# Patient Record
Sex: Female | Born: 2000 | Race: White | Hispanic: No | Marital: Single | Smoking: Never smoker
Health system: Southern US, Community
[De-identification: ages and names within clinical notes are randomized; demographics above are authoritative.]

## PROBLEM LIST (undated history)

## (undated) DIAGNOSIS — B009 Herpesviral infection, unspecified: Secondary | ICD-10-CM

## (undated) DIAGNOSIS — A64 Unspecified sexually transmitted disease: Secondary | ICD-10-CM

## (undated) HISTORY — PX: NO PAST SURGERIES: SHX2092

## (undated) HISTORY — DX: Unspecified sexually transmitted disease: A64

## (undated) HISTORY — DX: Herpesviral infection, unspecified: B00.9

---
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
## 2021-06-26 ENCOUNTER — Encounter: Payer: Self-pay | Admitting: Obstetrics

## 2021-06-27 ENCOUNTER — Other Ambulatory Visit: Payer: Self-pay | Admitting: Obstetrics

## 2021-06-27 DIAGNOSIS — A609 Anogenital herpesviral infection, unspecified: Secondary | ICD-10-CM

## 2021-06-27 MED ORDER — VALACYCLOVIR HCL 1 G PO TABS: 1000.0000 mg | ORAL_TABLET | Freq: Every day | ORAL | 6 refills | Status: AC

## 2021-06-27 NOTE — Telephone Encounter (Signed)
Patient called in reporting HSV outbreak. Rx for Valtrex 1000 mg po daily sent to pharmacy. She is overdue for an annual PE and is advised to make an appointment.  Mirna Mires, CNM  06/27/2021 11:01 AM

## 2021-06-27 NOTE — Telephone Encounter (Signed)
Can you send to [place]?

## 2022-10-12 IMAGING — CR DG CHEST 2V
2 series · 2 of 2 positions shown · non-contrast
Comparison: None.

CLINICAL DATA: Chest pain.  Cough and shortness of breath.

EXAM:
CHEST - 2 VIEW

[chest pa]
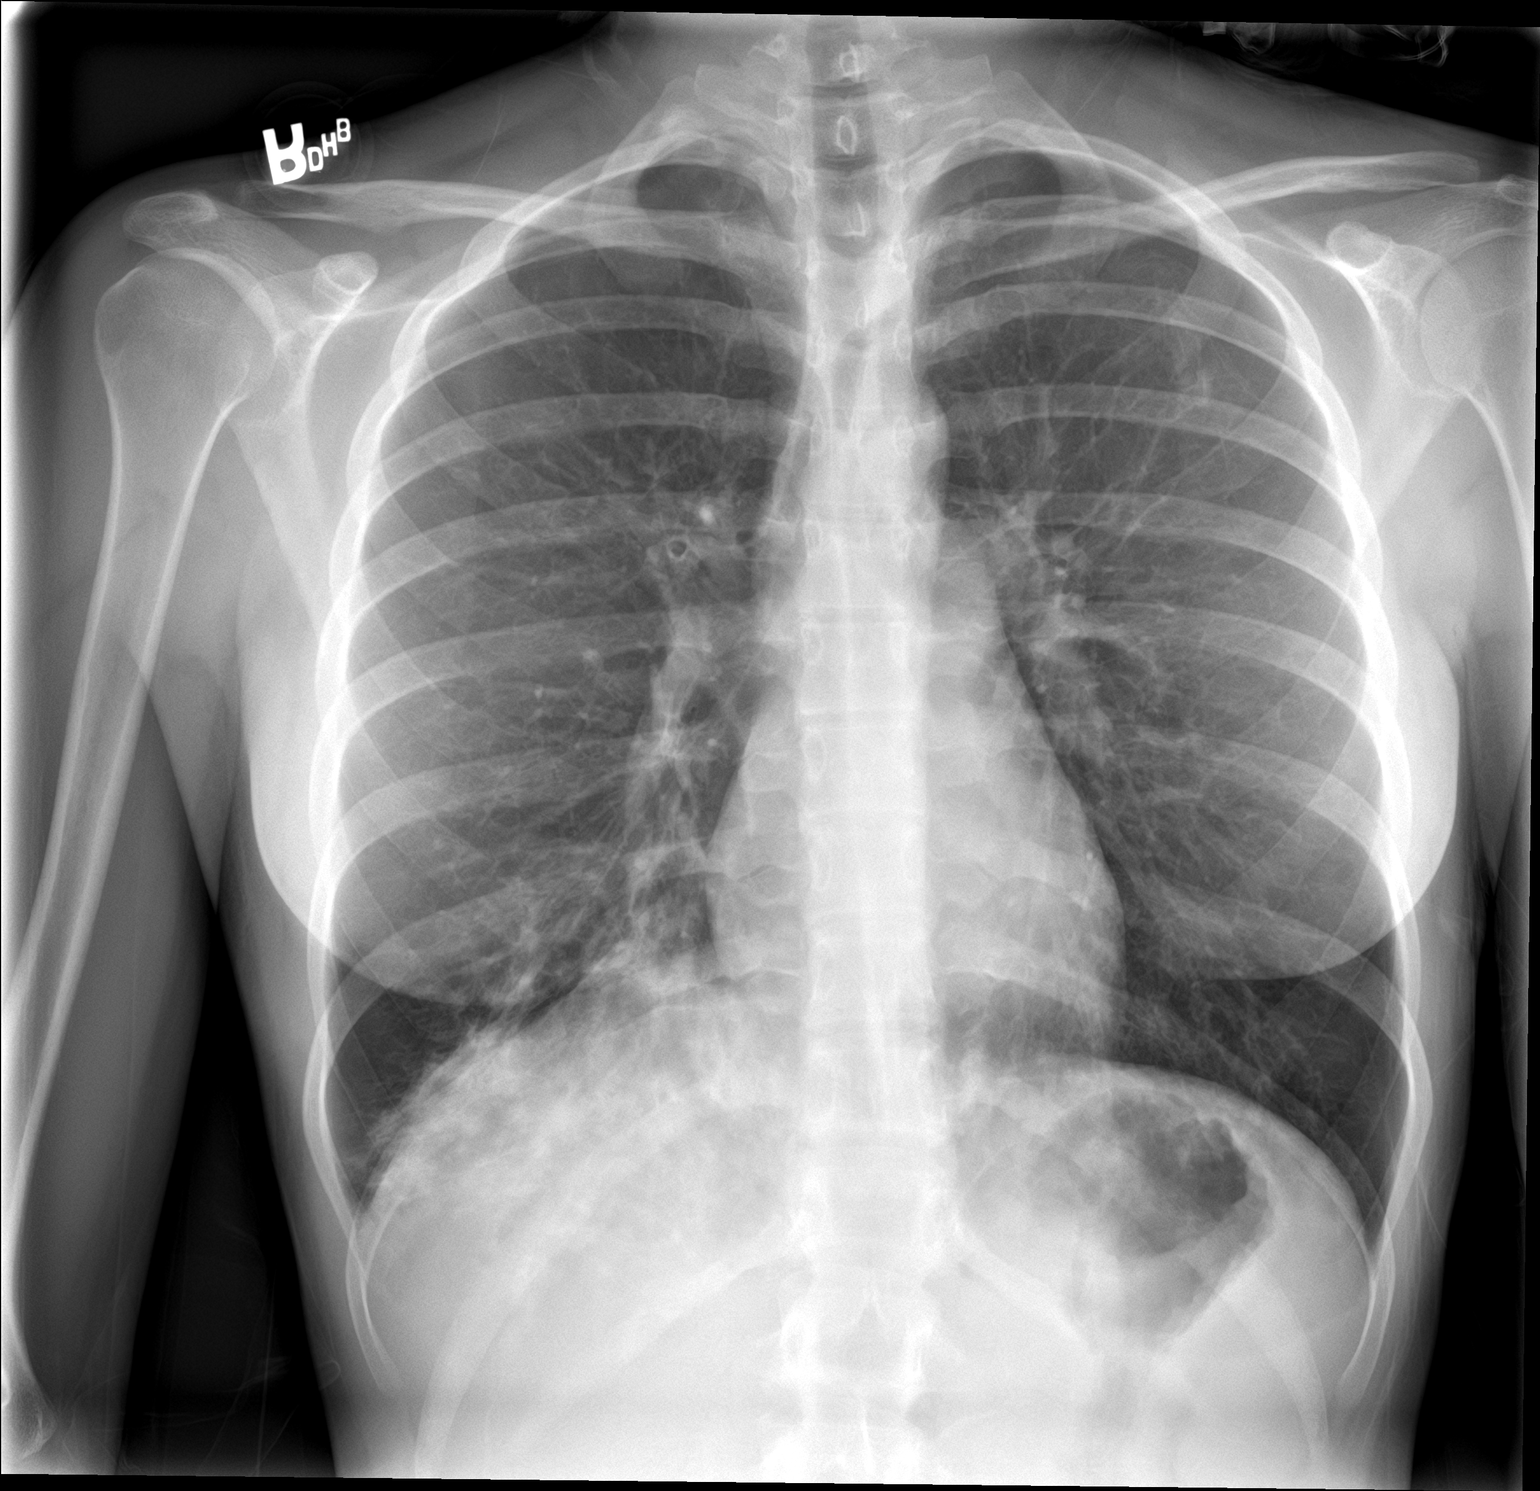

[chest lat]
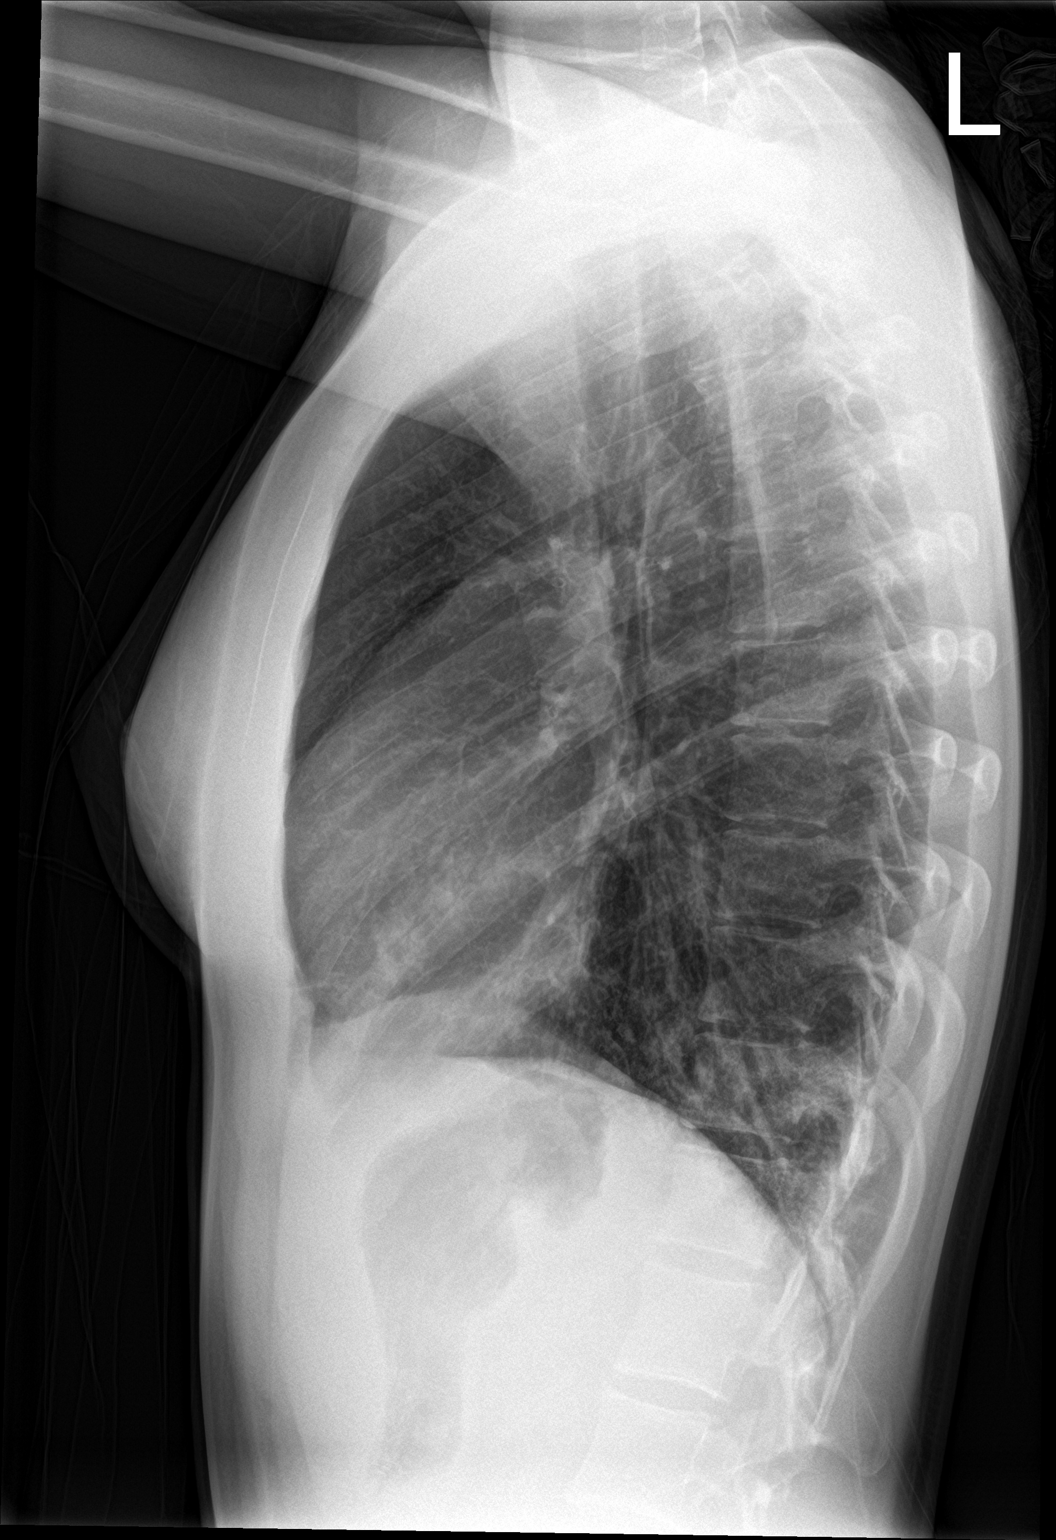

[2 of 2 positions shown; findings below may reference images not displayed]

FINDINGS: Patchy right lower lobe airspace disease consistent with pneumonia.
There is minimal right middle lobe involvement is well. Left lung is
clear. Heart is normal in size. No pleural effusion or pneumothorax.
No osseous abnormalities are seen.
IMPRESSION: Right lower lobe and right middle lobe pneumonia.

## 2023-09-27 ENCOUNTER — Ambulatory Visit: Payer: Self-pay | Admitting: Medical

## 2023-09-27 ENCOUNTER — Encounter: Payer: Self-pay | Admitting: Medical

## 2023-09-27 ENCOUNTER — Other Ambulatory Visit: Payer: Self-pay

## 2023-09-27 VITALS — BP 119/76 | HR 67 | Temp 99.1°F | Wt 98.0 lb

## 2023-09-27 DIAGNOSIS — Z113 Encounter for screening for infections with a predominantly sexual mode of transmission: Secondary | ICD-10-CM

## 2023-09-27 DIAGNOSIS — R300 Dysuria: Secondary | ICD-10-CM

## 2023-09-27 LAB — POCT URINALYSIS DIPSTICK (MANUAL)
Nitrite, UA: NEGATIVE
Poct Bilirubin: NEGATIVE
Poct Glucose: NORMAL mg/dL
Poct Ketones: NEGATIVE
Poct Protein: NEGATIVE mg/dL
Poct Urobilinogen: NORMAL mg/dL
Spec Grav, UA: 1.01 (ref 1.010–1.025)
pH, UA: 6 (ref 5.0–8.0)

## 2023-09-27 MED ORDER — NITROFURANTOIN MONOHYD MACRO 100 MG PO CAPS: 100.0000 mg | ORAL_CAPSULE | Freq: Two times a day (BID) | ORAL | 0 refills | Status: AC

## 2023-09-27 NOTE — Patient Instructions (Signed)
-  Take antibiotic every 12 hours (twice a day) with food; Finish all antibiotics. -Drink plenty of water. Avoid or limit alcohol and caffeine, which may make symptoms worse. -You may take an over-the-counter pain reliever (i.e., AZO urinary pain relief, Tylenol) as needed for pain next 1-2 days. -You will receive a MyChart message notifying you of your lab results when they are available.  -Send secure message to provider or schedule return appointment as needed for new/worsening symptoms (such as fever or abdominal pain), if your symptoms are not improving after 2-3 days taking antibiotics or if your symptoms do not completely resolve following antibiotics.

## 2023-09-27 NOTE — Progress Notes (Signed)
 [facility] 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  Has had one previous UTI, 3 years ago. Thinks current sx feel similar.   Sx began this AM. Some burning after urination. Some urgency. Some increased frequency but also drinking more water today. No unusual vaginal discharge or itching. Mild lower abdominal discomfort with urination. No back pain, fever, chills or nausea.   Sexually active with same female partner, has been with them or several months. Uses condoms most of time. Has not had sex in over a week. Thinks she may have gotten dehydrated, drinking alcohol more often than usual with graduation/end-of-year festivities.   Last STI testing in summer 2024.    Current Medication:  Outpatient Encounter Medications as of 09/27/2023  Medication Sig   norethindrone-ethinyl estradiol (LOESTRIN FE) 1-20 MG-MCG tablet Take 1 tablet by mouth daily.   [DISCONTINUED] lidocaine  (XYLOCAINE ) 2 % jelly Apply 1 application topically as needed.   [DISCONTINUED] ondansetron  (ZOFRAN  ODT) 4 MG disintegrating tablet Take 1 tablet (4 mg total) by mouth every 6 (six) hours as needed for nausea. (Patient not taking: Reported on 09/27/2023)   No facility-administered encounter medications on file as of 09/27/2023.      Medical History: Past Medical History:  Diagnosis Date   Herpes    STD (sexually transmitted disease)      Vital Signs: BP 119/76   Pulse 67   Temp 99.1 F (37.3 C) (Tympanic)   Wt 98 lb (44.5 kg)   SpO2 94% Comment: acrylic nails  BMI 18.52 kg/m    Review of Systems See HPI  Physical Exam Vitals reviewed.   Constitutional:      General: She is not in acute distress.    Appearance: She is not ill-appearing.  Cardiovascular:     Rate and Rhythm: Normal rate and regular rhythm.     Heart sounds: No murmur heard.    No friction rub. No gallop.  Pulmonary:     Effort: Pulmonary effort is normal.     Breath sounds: Normal breath sounds. No wheezing, rhonchi or rales.  Abdominal:     Palpations: Abdomen is soft.     Tenderness: There is no right CVA tenderness or left CVA tenderness.     Comments: Slight suprapubic tenderness.  Neurological:     Mental Status: She is alert.     Results for orders placed or performed in visit on 09/27/23 (from the past 24 hours)  POCT Urinalysis Dip Manual     Status: Abnormal   Collection Time: 09/27/23  3:54 PM  Result Value Ref Range   Spec Grav, UA 1.010 1.010 - 1.025   pH, UA 6.0 5.0 - 8.0   Leukocytes, UA Small (1+) (A) Negative   Nitrite, UA Negative Negative   Poct Protein Negative Negative, trace mg/dL   Poct Glucose Normal Normal mg/dL   Poct Ketones Negative Negative   Poct Urobilinogen Normal Normal mg/dL   Poct Bilirubin Negative Negative   Poct Blood trace Negative, trace     Assessment/Plan: 1. Dysuria (Primary)  - POCT Urinalysis Dip Manual - Urine Culture - Chlamydia/Gonococcus/Trichomonas, NAA - nitrofurantoin, macrocrystal-monohydrate, (MACROBID) 100 MG capsule;  Take 1 capsule (100 mg total) by mouth 2 (two) times daily for 5 days.  Dispense: 10 capsule; Refill: 0  2. Screening examination for STD (sexually transmitted disease)  - Chlamydia/Gonococcus/Trichomonas, NAA  Symptoms and urinalysis some suspicious for UTI. Will start empiric antibiotic, send urine for cx and vaginal self-swab for STI testing.  Patient Instructions  -Take antibiotic every 12 hours (twice a day) with food; Finish all antibiotics. -Drink plenty of water. Avoid or limit alcohol and caffeine, which may make symptoms worse. -You may take an  over-the-counter pain reliever (i.e., AZO urinary pain relief, Tylenol ) as needed for pain next 1-2 days. -You will receive a MyChart message notifying you of your lab results when they are available.  -Send secure message to provider or schedule return appointment as needed for new/worsening symptoms (such as fever or abdominal pain), if your symptoms are not improving after 2-3 days taking antibiotics or if your symptoms do not completely resolve following antibiotics.       General Counseling: Miyabi has been encouraged to call the office with any questions or concerns that should arise related to todays visit.    Visit coded for established patient. Patient has been seen at our clinic in last 3 years (documented on different EHR).    Ponciano Bristle PA-C [employer] 09/27/2023 3:58 PM

## 2023-09-30 ENCOUNTER — Ambulatory Visit: Payer: Self-pay | Admitting: Medical

## 2023-09-30 LAB — CHLAMYDIA/GONOCOCCUS/TRICHOMONAS, NAA
Chlamydia by NAA: NEGATIVE
Gonococcus by NAA: NEGATIVE
Trich vag by NAA: NEGATIVE

## 2023-10-01 LAB — URINE CULTURE
